# Patient Record
Sex: Male | Born: 1970
Health system: Southern US, Community
[De-identification: ages and names within clinical notes are randomized; demographics above are authoritative.]

## PROBLEM LIST (undated history)

## (undated) DIAGNOSIS — A048 Other specified bacterial intestinal infections: Secondary | ICD-10-CM

## (undated) DIAGNOSIS — M129 Arthropathy, unspecified: Secondary | ICD-10-CM

## (undated) DIAGNOSIS — L409 Psoriasis, unspecified: Secondary | ICD-10-CM

## (undated) DIAGNOSIS — K219 Gastro-esophageal reflux disease without esophagitis: Secondary | ICD-10-CM

## (undated) HISTORY — DX: Psoriasis, unspecified: L40.9

## (undated) HISTORY — DX: Gastro-esophageal reflux disease without esophagitis: K21.9

## (undated) HISTORY — DX: Arthropathy, unspecified: M12.9

## (undated) HISTORY — DX: Other specified bacterial intestinal infections: A04.8

---

## 2002-09-17 ENCOUNTER — Ambulatory Visit (HOSPITAL_BASED_OUTPATIENT_CLINIC_OR_DEPARTMENT_OTHER): Admission: RE | Admit: 2002-09-17 | Discharge: 2002-09-17 | Payer: Self-pay | Admitting: *Deleted

## 2008-03-13 ENCOUNTER — Emergency Department (HOSPITAL_COMMUNITY): Admission: EM | Admit: 2008-03-13 | Discharge: 2008-03-13 | Payer: Self-pay | Admitting: *Deleted

## 2010-04-29 ENCOUNTER — Encounter: Admission: RE | Admit: 2010-04-29 | Discharge: 2010-04-29 | Payer: Self-pay | Admitting: Family Medicine

## 2010-06-30 ENCOUNTER — Encounter (INDEPENDENT_AMBULATORY_CARE_PROVIDER_SITE_OTHER): Payer: Self-pay | Admitting: *Deleted

## 2010-06-30 DIAGNOSIS — B9789 Other viral agents as the cause of diseases classified elsewhere: Secondary | ICD-10-CM | POA: Insufficient documentation

## 2010-07-04 ENCOUNTER — Ambulatory Visit: Payer: Self-pay | Admitting: Infectious Disease

## 2010-07-04 DIAGNOSIS — R5383 Other fatigue: Secondary | ICD-10-CM

## 2010-07-04 DIAGNOSIS — R5381 Other malaise: Secondary | ICD-10-CM

## 2010-07-04 DIAGNOSIS — M129 Arthropathy, unspecified: Secondary | ICD-10-CM

## 2010-07-04 DIAGNOSIS — R141 Gas pain: Secondary | ICD-10-CM | POA: Insufficient documentation

## 2010-07-04 DIAGNOSIS — IMO0001 Reserved for inherently not codable concepts without codable children: Secondary | ICD-10-CM

## 2010-07-04 DIAGNOSIS — R143 Flatulence: Secondary | ICD-10-CM

## 2010-07-04 DIAGNOSIS — A048 Other specified bacterial intestinal infections: Secondary | ICD-10-CM

## 2010-07-04 DIAGNOSIS — K219 Gastro-esophageal reflux disease without esophagitis: Secondary | ICD-10-CM | POA: Insufficient documentation

## 2010-07-04 DIAGNOSIS — R142 Eructation: Secondary | ICD-10-CM

## 2010-07-04 DIAGNOSIS — R197 Diarrhea, unspecified: Secondary | ICD-10-CM | POA: Insufficient documentation

## 2010-07-04 HISTORY — DX: Gastro-esophageal reflux disease without esophagitis: K21.9

## 2010-07-04 HISTORY — DX: Other specified bacterial intestinal infections: A04.8

## 2010-07-04 HISTORY — DX: Arthropathy, unspecified: M12.9

## 2010-07-04 LAB — CONVERTED CEMR LAB
Albumin: 4.4 g/dL (ref 3.5–5.2)
Alkaline Phosphatase: 69 units/L (ref 39–117)
CRP: 0.1 mg/dL (ref <0.6)
Creatinine, Ser: 0.99 mg/dL (ref 0.40–1.50)
Cytomegalovirus Ab-IgG: 0.27 (ref <0.90)
EBV NA IgG: 2.18 — ABNORMAL HIGH
Eosinophils Absolute: 0 10*3/uL (ref 0.0–0.7)
HCV Ab: NEGATIVE
HIV 1 RNA Quant: <20 copies/mL (ref <20)
HIV: NONREACTIVE
Helicobacter Pylori Antibody-IgG: 0.5
Hemoglobin: 16.5 g/dL (ref 13.0–17.0)
Hep B Core Total Ab: NEGATIVE
Hepatitis B Surface Ag: NEGATIVE
Lymphocytes Relative: 33 % (ref 12–46)
Lymphs Abs: 1.8 10*3/uL (ref 0.7–4.0)
MCHC: 35.5 g/dL (ref 30.0–36.0)
MCV: 92.1 fL (ref 78.0–100.0)
Monocytes Relative: 5 % (ref 3–12)
Neutro Abs: 3.3 10*3/uL (ref 1.7–7.7)
Neutrophils Relative %: 62 % (ref 43–77)
RDW: 12.3 % (ref 11.5–15.5)
Sed Rate: 2 mm/hr (ref 0–16)
Sodium: 140 meq/L (ref 135–145)
TSH: 1.319 microintl units/mL (ref 0.350–4.500)
Total Bilirubin: 0.7 mg/dL (ref 0.3–1.2)
Total CK: 103 units/L (ref 7–232)

## 2010-07-07 ENCOUNTER — Encounter: Payer: Self-pay | Admitting: Infectious Disease

## 2010-10-13 NOTE — Miscellaneous (Signed)
Summary: HIPAA Restrictions  HIPAA Restrictions   Imported By: Florinda Marker 07/04/2010 11:04:21  _____________________________________________________________________  External Attachment:    Type:   Image     Comment:   External Document

## 2010-10-13 NOTE — Assessment & Plan Note (Signed)
Summary: new pt viral ilnfection   Visit Type:  Consult Referring Provider:  Forde Dandy Primary Provider:  Forde Dandy, MD  CC:  referred by Dr. Parke Simmers and Depression.  History of Present Illness: 40 yo Caucasian male with no PMHX presents to clinic after referral from Dr. Parke Simmers. The patient and his wife, and daughters began experiencing bloating diarhea, joint pains. Most of these symptoms have waxed and waned since then. The patient himself has also had  elbow and wrist joint pains, body aches, lethargic, fatigue. Then developed lower back pain. Felt malaise. He went to urgent care in [place]. Apparently this MD did work up for tick borne illnesses. His PCP Dr. Parke Simmers did further workup and CT abomen and pelvis. CBC< CMP, RA were all negative. The pt has never had a fever. He has prominent symptoms of burping, GERD, bloating and malaise and fatigue. They all drink well water at home. No known h pylori though no one has been tested. No travel recently  Problems Prior to Update: 1)  Viral Infection  (ICD-079.99)  Medications Prior to Update: 1)  Mobic 15 Mg Tabs (Meloxicam)  Current Medications (verified): 1)  Mobic 15 Mg Tabs (Meloxicam)  Allergies: 1)  ! Pcn    Current Allergies (reviewed today): ! PCN Past History:  Family History: Last updated: 07/04/2010 no rheumatological problems. 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  Past Medical History: uri  Past Surgical History: none  Family History: no rheumatological problems. 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  Review of Systems       The patient complains of abdominal pain and severe  indigestion/heartburn.  The patient denies anorexia, fever, weight loss, weight gain, vision loss, decreased hearing, hoarseness, chest pain, syncope, dyspnea on exertion, peripheral edema, prolonged cough, headaches, hemoptysis, melena, hematochezia, hematuria, incontinence, genital sores, muscle weakness, suspicious skin lesions, transient blindness, difficulty walking, depression, unusual weight change, abnormal bleeding, enlarged lymph nodes, and angioedema.    Vital Signs:  Patient profile:   40 year old male Height:      70 inches (177.80 cm) Weight:      177.50 pounds (80.68 kg) BMI:     25.56 Pulse rate:   63 / minute BP sitting:   142 / 84  (left arm)  Vitals Entered By: Starleen Arms CMA (July 04, 2010 10:48 AM) CC: referred by Dr. Parke Simmers, Depression Is Patient Diabetic? No Pain Assessment Patient in pain? yes       Have you ever been in a relationship where you felt threatened, hurt or afraid?No   Does patient need assistance? Functional Status Self care Ambulation Normal   Physical Exam  General:  alert, well-developed, well-nourished, and well-hydrated.   Head:  normocephalic, atraumatic, and no abnormalities observed.   Eyes:  vision grossly intact, pupils equal, pupils round, and pupils reactive to light.   Ears:  no external deformities.   Nose:  no external deformity.   Mouth:  good dentition, no dental plaque, pharynx pink and moist, no erythema, and no exudates.   Neck:  supple and full ROM.   Chest Wall:  no deformities.   Lungs:  normal respiratory effort, no crackles, and no wheezes.   Heart:  normal rate, regular  rhythm, no murmur, no gallop, and no rub.   Abdomen:  soft, non-tender, normal bowel sounds, no distention, no masses, no hepatomegaly, and no splenomegaly.   Msk:  normal ROM, no joint tenderness, no joint swelling, and no joint warmth.   Extremities:  trace left pedal edema and trace right pedal edema.   Neurologic:  alert & oriented  X3, strength normal in all extremities, and sensation intact to light touch.   Skin:  turgor normal, no rashes, and no petechiae.   Psych:  Oriented X3, memory intact for recent and remote, and normally interactive.     Impression & Recommendations:  Problem # 1:  ABDOMINAL BLOATING (ICD-787.3) I wonder if this could all be explained by h pylor infection. It could certainlhy cause bloating GERD, and could be passed around family members. I am not familiar with it inducing arthritic symptoms though. will check stool h pylori ag and serum ab to h pylori See below further   His updated medication list for this problem includes:    Mobic 15 Mg Tabs (Meloxicam)  Orders: Consultation Level IV (16109)  Problem # 2:  DIARRHEA (ICD-787.91) Will workup further for stool ova and parasites, giardia, crypto from stool, stool. stool cultures Orders: T-Stool Culture (60454) T-Ova and Parasites (09811) Consultation Level IV (91478)  Problem # 3:  ARTHRITIS (ICD-716.90) will check viral hepatitis panel, check hiv, check ebv, cmv, parvovirus abs, check ana, esr, crp, cbc cmp, cpk Orders:  T-Antinuclear Antib (ANA) (29562-13086) T-C-Reactive Protein [phone]) T-GC Probe, urine [phone]) T- GC Chlamydia (02725) T- Sed rate non-auto (36644)  Problem # 4:  FATIGUE (ICD-780.79) workup for viral infections described above and stool pathogens, initate autoimmune workup. However fact that entire family has had similar siymptoms would point to ID cause Orders: T-TSH [phone]) Consultation Level IV [phone])  Other Orders: T-Stool Giardia / Crypto- EIA ([address]) T-Comprehensive Metabolic Panel [phone]) T-CBC w/Diff (30160-10932) T-CK Total [id number]) T-CMV IgG Antibody (42706-23762) T-CMV IgM  Antibody ([address]) T-Helicobacter AB - IgG (07371-06269) T-Epstein-Barr virus, early antigen (48546-27035) T-Epstein-Barr virus, nuclear antigen [phone]) T-Epstein-Barr virus, viral  capsid (37169-67893) T-HIV Antibody  (Reflex) ([id number]) T-H Pylori AG, EIA (85277) T-Hepatitis C Viral Load (82423-53614) T-Hepatitis C Antibody (43154-00867) T-Hepatitis B Surface Antigen (61950-93267) T-Hepatitis B Surface Antibody (12458-09983) T-Hepatitis B Core Antibody (38250-53976) T-Hepatitis A Antibody (73419-37902) T- * Misc. Laboratory test [phone]) T- * Misc. Laboratory test [phone]) T-HIV Viral Load [phone])   Patient Instructions: 1)  rtc to see Dr> Daiva Eves on November 9th 2)  I will call you if you need treatment in the meantime

## 2010-10-13 NOTE — Miscellaneous (Signed)
Summary: clinical list update  Clinical Lists Changes  Problems: Added new problem of VIRAL INFECTION (ICD-079.99) Medications: Added new medication of MOBIC 15 MG TABS (MELOXICAM) Allergies: Added new allergy or adverse reaction of PCN Observations: Added new observation of NKA: F (06/30/2010 16:30)

## 2010-10-13 NOTE — Consult Note (Signed)
Summary: [facility]   Imported By: Florinda Marker 07/07/2010 15:16:37  _____________________________________________________________________  External Attachment:    Type:   Image     Comment:   External Document

## 2010-11-21 ENCOUNTER — Telehealth: Payer: Self-pay | Admitting: *Deleted

## 2010-11-21 ENCOUNTER — Encounter: Payer: Self-pay | Admitting: Infectious Disease

## 2010-11-29 NOTE — Letter (Signed)
Summary: [facility]  [address]   [place], [place] [address]   Phone: [phone]  Fax: [phone]    11/21/2010  [name] [address] [place], [place]  [address]  Dear [name],  Michael Pennington was worked up for flu like symptoms, and athritis with a Hepatitis panel, to exclude viral causes of arthritis. I feel that this was medically necessary.           Sincerely,  Dr. Paulette Blanch [name]

## 2010-11-29 NOTE — Progress Notes (Signed)
Summary: [employer] needing supporting data for labwork @ 07/04/10 OV  Phone Note Other Incoming   Caller: [facility], Claims Dept. fax [phone] Reason for Call: Discuss billing issue Summary of Call: [facility] requiring furhter documentation to support labwork performed during 07/04/10 OV.  Jennet Maduro RN  November 21, 2010 10:36 AM

## 2011-01-27 NOTE — Op Note (Signed)
   NAME:  Michael Pennington, Michael Pennington                         ACCOUNT NO.:  [id number]   MEDICAL RECORD NO.:  [id number]                   PATIENT TYPE:  AMB   LOCATION:  DSC                                  FACILITY:  [facility]   PHYSICIAN:  Lowell Bouton, M.D.      DATE OF BIRTH:  1971/05/14   DATE OF PROCEDURE:  09/17/2002  DATE OF DISCHARGE:                                 OPERATIVE REPORT   PREOPERATIVE DIAGNOSIS:  Foreign body, right thumb.   POSTOPERATIVE DIAGNOSIS:  Foreign body, right thumb.   OPERATION PERFORMED:  Excision of foreign body, right thumb.   SURGEON:  Lowell Bouton, M.D.   ANESTHESIA:  Half percent Marcaine local.   OPERATIVE FINDINGS:  The patient had a piece of glass from a mirror on a car  that had broken into three small pieces.  They were in the subcutaneous  tissues of the pulp of the right thumb.   DESCRIPTION OF OPERATION:  Under half percent Marcaine local anesthesia in  the minor room at [facility] the right hand was prepped and draped in  the usual fashion.  The hand was elevated and a forearm tourniquet was  inflated to 250 mmHg.  An oblique incision was made over the entrance of the  foreign body on the pulp of the right thumb.  The piece of glass was  identified in the subcutaneous tissues upon making the incision.  A second  piece of glass was then bluntly dissected out and a third piece of glass was  bluntly dissected out.   The wound was then closed with 4-0 nylon sutures.  Sterile dressings were  applied and the tourniquet was released.   The patient was discharged in good condition.                                                 Lowell Bouton, M.D.    EMM/MEDQ  D:  09/17/2002  T:  09/17/2002  Job:  [id number]

## 2011-06-08 LAB — DIFFERENTIAL
Basophils Relative: 0
Eosinophils Relative: 0
Lymphs Abs: 0.7
Monocytes Absolute: 0.4
Monocytes Relative: 5
Neutro Abs: 6.2

## 2011-06-08 LAB — CBC
HCT: 47.1
MCV: 93.6
Platelets: 162
RDW: 12.4
WBC: 7.3

## 2011-06-08 LAB — COMPREHENSIVE METABOLIC PANEL
AST: 30
Alkaline Phosphatase: 79
CO2: 23
GFR calc Af Amer: >60
GFR calc non Af Amer: >60
Potassium: 3.8
Total Bilirubin: 1.3 — ABNORMAL HIGH

## 2011-06-08 LAB — URINALYSIS, ROUTINE W REFLEX MICROSCOPIC
Hgb urine dipstick: NEGATIVE
Leukocytes, UA: NEGATIVE
pH: 7.5

## 2011-06-08 LAB — URINE MICROSCOPIC-ADD ON

## 2013-03-12 ENCOUNTER — Emergency Department (HOSPITAL_COMMUNITY)
Admission: EM | Admit: 2013-03-12 | Discharge: 2013-03-12 | Disposition: A | Discharge status: Home / Self Care | Payer: 59 | Attending: Emergency Medicine | Admitting: Emergency Medicine

## 2013-03-12 ENCOUNTER — Encounter (HOSPITAL_COMMUNITY): Payer: Self-pay

## 2013-03-12 ENCOUNTER — Emergency Department (HOSPITAL_COMMUNITY): Payer: 59

## 2013-03-12 DIAGNOSIS — M549 Dorsalgia, unspecified: Secondary | ICD-10-CM | POA: Insufficient documentation

## 2013-03-12 DIAGNOSIS — Z880 Allergy status to penicillin: Secondary | ICD-10-CM | POA: Insufficient documentation

## 2013-03-12 MED ORDER — TRAMADOL HCL 50 MG PO TABS: 50.0000 mg | ORAL_TABLET | Freq: Four times a day (QID) | ORAL | Status: DC | PRN

## 2013-03-12 NOTE — Progress Notes (Signed)
Pt confirms pcp is veita bland  EPIC updated  

## 2013-03-12 NOTE — ED Provider Notes (Signed)
Medical screening examination/treatment/procedure(s) were conducted as a shared visit with non-physician practitioner(s) and myself.  I personally evaluated the patient during the encounter Pt with mid thoracic back pain, worse with movement.  No SOB, chest pain.  Has had similar symptoms off/on for 3 years.  Has had CT chest, endoscopy, x-rays for same symptoms in past.  Pt upset that he got an x-ray today that he did not need.  I explained that he got it since his pain had been ongoing and worsening to r/o tumor.  Will f/u with his PMD  Rolan Bucco, MD 03/12/13 1510

## 2013-03-12 NOTE — ED Notes (Signed)
Pt states at work, developed mid lt back pain with no injury, states burping/gas now; denies pain radiating

## 2013-03-12 NOTE — ED Provider Notes (Signed)
History    CSN: [id number] Arrival date & time 03/12/13  1059  First MD Initiated Contact with Patient 03/12/13 1110     Chief Complaint  Patient presents with  . Back Pain   (Consider location/radiation/quality/duration/timing/severity/associated sxs/prior Treatment) HPI  Patient is a 42 year old male presented to the emergency department for intermittent sharp mid thoracic back pain without radiation. Patient states his pain is worse on the left side from the right Patient rates his pain as an 8/10. Patient describes associated burping and excessive gas with this pain. Patient states he was evaluated for this initially 3 years ago per his primary care doctor had him worked up by an Administrator, arts and gastroenterologist for the same symptoms where he underwent tests including but not limited to an endoscopy with a completely negative workup. Patient states he's had these symptoms on and off over the last 3 years but today states his pain was bothering him too much at work so he decided to come in to be evaluated. Patient states his pain is worsened with movement he has no alleviating factors. Patient denies any chest pain, diaphoresis, shortness of breath, cough, nausea, vomiting, fever, chills, abdominal pain, unilateral leg swelling, numbness or tingling in extremities, bladder or bowel incontinence. PERC negative.  History reviewed. No pertinent past medical history. History reviewed. No pertinent past surgical history. No family history on file. History  Substance Use Topics  . Smoking status: Never Smoker   . Smokeless tobacco: Not on file  . Alcohol Use: No    Review of Systems  Constitutional: Negative for fever, chills and diaphoresis.  HENT: Negative.  Negative for facial swelling, neck pain and neck stiffness.   Eyes: Negative.   Respiratory: Negative for cough, chest tightness and shortness of breath.   Cardiovascular: Negative for chest pain, palpitations and leg swelling.    Gastrointestinal: Negative for nausea, vomiting, abdominal pain, diarrhea and constipation.  Genitourinary: Negative.   Musculoskeletal: Positive for back pain.  Skin: Negative.   Neurological: Negative for syncope, weakness, numbness and headaches.    Allergies  Penicillins  Home Medications   Current Outpatient Rx  Name  Route  Sig  Dispense  Refill  . traMADol (ULTRAM) 50 MG tablet   Oral   Take 1 tablet (50 mg total) by mouth every 6 (six) hours as needed for pain.   30 tablet   0    BP 128/90  Pulse 59  Temp(Src) 98.2 F (36.8 C) (Oral)  Resp 18  SpO2 100% Physical Exam  Constitutional: He is oriented to person, place, and time. He appears well-developed and well-nourished. No distress.  HENT:  Head: Normocephalic and atraumatic.  Eyes: Conjunctivae are normal.  Neck: Neck supple.  Cardiovascular: Normal rate, regular rhythm, normal heart sounds and intact distal pulses.   Pulmonary/Chest: Effort normal and breath sounds normal. No respiratory distress. He exhibits no tenderness.  Abdominal: Soft. Bowel sounds are normal. He exhibits no distension. There is no tenderness. There is no rebound.  Musculoskeletal: He exhibits no edema.       Arms: Neurological: He is alert and oriented to person, place, and time. No cranial nerve deficit.  Skin: Skin is warm and dry. He is not diaphoretic.  Psychiatric: He has a normal mood and affect.    ED Course  Procedures (including critical care time) Labs Reviewed - No data to display Dg Thoracic Spine 2 View  03/12/2013   *RADIOLOGY REPORT*  Clinical Data: Thoracic back pain.  THORACIC SPINE -  2 VIEW  Comparison: Chest x-ray dated 03/13/2008  Findings: The patient has only 11 rib-bearing vertebra.  There is no fracture, disc space narrowing, bone destruction, or other abnormality.  IMPRESSION: Normal thoracic spine except there are only 11 non-rib bearing vertebra.   Original Report Authenticated By: Francene Boyers, M.D.   1.  Back pain     MDM  Patient with midthoracic back pain without neuro focal deficits.  Normal neuro exam.  No cardiac history or in chest pain, diaphoresis, shortness of breath, reproducible chest pain to cause concern for cardiac etiology. Abdomen soft nontender nondistended with good bowel sounds. Pt is PERC negative. No concern for PE. No loss of bowel or bladder control.  No concern for cauda equina.  No fever, night sweats, weight loss, h/o cancer, IVDU.  VSS. RICE protocol and pain medicine indicated and discussed with patient. Patient advised given history of this in he'll need to followup with his parent care doctor for further investigation to see as are the undergone extensive testing in the past for similar symptoms and he should continue care under the initiating physician. Claims no concern for acute emergent cause of his back pain at this time. Patient extremely upset over receiving an x-ray but did not decline the x-ray at any point of his time in the ED. also explained to patient that he would not be doing any further imaging on him at this time because of low concern for acute emergent causes either cardiac or pulmonary or abdominal nature. Patient was spoken to about this by myself, nursing staff, and Dr. Fredderick Phenix.   Jeannetta Ellis, PA-C 03/12/13 1405

## 2016-11-29 ENCOUNTER — Ambulatory Visit: Payer: Self-pay | Admitting: Internal Medicine

## 2017-10-29 ENCOUNTER — Encounter: Payer: Self-pay | Admitting: Physician Assistant

## 2017-10-29 ENCOUNTER — Ambulatory Visit (INDEPENDENT_AMBULATORY_CARE_PROVIDER_SITE_OTHER): Payer: 59 | Admitting: Physician Assistant

## 2017-10-29 ENCOUNTER — Encounter (INDEPENDENT_AMBULATORY_CARE_PROVIDER_SITE_OTHER): Payer: Self-pay

## 2017-10-29 VITALS — BP 130/82 | HR 70 | Ht 70.0 in | Wt 178.8 lb

## 2017-10-29 DIAGNOSIS — R0789 Other chest pain: Secondary | ICD-10-CM | POA: Diagnosis not present

## 2017-10-29 NOTE — Patient Instructions (Signed)
Medication Instructions:  1. Your physician recommends that you continue on your current medications as directed. Please refer to the Current Medication list given to you today.   Labwork: NONE ORDERED TODAY  Testing/Procedures: Your physician has requested that you have an exercise tolerance test. For further information please visit www.cardiosmart.org. Please also follow instruction sheet, as given.    Follow-Up: FOLLOW UP AS NEEDED PENDING TEST RESULTS   Any Other Special Instructions Will Be Listed Below (If Applicable).     If you need a refill on your cardiac medications before your next appointment, please call your pharmacy.   

## 2017-10-29 NOTE — Progress Notes (Signed)
Cardiology Office Note:    Date:  10/29/2017   ID:  Michael Pennington, DOB 02-04-1971, MRN [id number]  PCP:  Verlon AuBoyd, Tammy Lamonica, MD  Cardiologist:   New   Referring MD: Verlon AuBoyd, Tammy Lamonica, MD   Chief Complaint  Patient presents with  . Chest Pain    History of Present Illness:    Michael Pennington is a 47 y.o. male with a hx of psoriasis who is being seen today for the evaluation of chest pain at the request of Verlon AuBoyd, Tammy Lamonica, MD.   Michael Pennington has a history of psoriasis.  He has not had a history of diabetes, hyperlipidemia, hypertension ischemic heart disease or stroke.  He has a remote hx of smoking cigarettes.  He had an evaluation 8 years ago for diffuse arthralgias and belching.  He had an upper endoscopy, abdominal CT which were reportedly unremarkable.  He also was seen by rheumatology without significant findings.  Of note, he had left scapular discomfort with this.  His symptoms eventually resolved.  Last week he started noticing left scapular pain again.  He also noted a soreness in his substernal chest.  Several times last week, he had symptoms of lightheadedness.  One episode occurred shortly after standing.  He denies frank syncope.  He denies exertional chest discomfort.  He denies left scapular pain with range of motion.  He denies he has some neck stiffness for about a week recently.  He also had URI.  All of his symptoms have since resolved, except the L scapular pain.  PAD Screen 10/29/2017  Previous PAD dx? No  Previous surgical procedure? No  Pain with walking? No  Feet/toe relief with dangling? No  Painful, non-healing ulcers? No  Extremities discolored? No    Prior CV studies:   The following studies were reviewed today:  None   Past Medical History:  Diagnosis Date  . ARTHRITIS 07/04/2010  . GERD 07/04/2010   Qualifier: Diagnosis of  By: Daiva EvesVan Dam MD, Remi Haggardornelius    . HELICOBACTER PYLORI INFECTION 07/04/2010  . Psoriasis     History reviewed. No  pertinent surgical history.  Current Medications: Current Meds  Medication Sig  . Clobetasol Propionate (TEMOVATE) 0.05 % external spray Apply 1 spray topically 2 (two) times daily as needed (FOR SKIN IRRITATION).     Allergies:   Penicillins   Social History   Socioeconomic History  . Marital status: Married    Spouse name: None  . Number of children: 3  . Years of education: 5914  . Highest education level: None  Social Needs  . Financial resource strain: None  . Food insecurity - worry: None  . Food insecurity - inability: None  . Transportation needs - medical: None  . Transportation needs - non-medical: None  Occupational History  . Occupation: Sports administratorMechanic    Employer: [employer]  Tobacco Use  . Smoking status: Former Smoker    Packs/day: 0.50    Years: 9.00    Pack years: 4.50    Types: Cigarettes    Last attempt to quit: 1994    Years since quitting: 25.1  . Smokeless tobacco: Never Used  Substance and Sexual Activity  . Alcohol use: No  . Drug use: No  . Sexual activity: None  Other Topics Concern  . None  Social History Narrative   Originally from [place] - attended [employer], tx to [employer] and graduated from there   Attended 2 years at [employer]  in [place]     Family Hx: The patient's family history includes Diabetes in his brother. There is no history of CAD.  ROS:   Please see the history of present illness.    Review of Systems  Cardiovascular: Positive for chest pain.  Musculoskeletal: Positive for back pain.   All other systems reviewed and are negative.   EKGs/Labs/Other Test Reviewed:    EKG:  EKG is  ordered today.  The ekg ordered today demonstrates normal sinus rhythm, heart rate 67, normal axis, QTC 412 ms  Recent Labs:   Recent Lipid Panel    Physical Exam:    VS:  BP 130/82   Pulse 70   Ht 5\' 10"  (1.778 m)   Wt 178 lb 12.8 oz (81.1 kg)   SpO2 97%   BMI 25.66 kg/m     Wt Readings from Last 3  Encounters:  10/29/17 178 lb 12.8 oz (81.1 kg)     Physical Exam  Constitutional: He is oriented to person, place, and time. He appears well-developed and well-nourished. No distress.  HENT:  Head: Normocephalic and atraumatic.  Neck: No JVD present. Carotid bruit is not present. No thyromegaly present.  Cardiovascular: Normal rate and regular rhythm. Exam reveals friction rub. Exam reveals no gallop.  No murmur heard. Pulmonary/Chest: Effort normal. He has no rales.  Abdominal: Soft. He exhibits no mass. There is no tenderness.  Musculoskeletal: He exhibits no edema.  Neurological: He is alert and oriented to person, place, and time.  Skin: Skin is warm and dry.    ASSESSMENT & PLAN:    1.  Other chest pain  Patient presents with chest discomfort with fairly atypical features.  He also has had some dizziness/lightheadedness.  He denies frank syncope.  He lacks significant risk factors for coronary artery disease.  His ECG is normal.  Framingham 10-year risk score 3.5%.  This places him at low risk for MACE.  I have recommended proceeding with a plain exercise treadmill test to further evaluate his symptoms.  If this is normal, he can follow-up with cardiology as needed.   Dispo:  Return as needed .   Medication Adjustments/Labs and Tests Ordered: Current medicines are reviewed at length with the patient today.  Concerns regarding medicines are outlined above.  Orders/Tests:  Orders Placed This Encounter  Procedures  . Exercise Tolerance Test  . EKG 12-Lead   Medication changes: No orders of the defined types were placed in this encounter.  Signed, Tereso Newcomer, PA-C  10/29/2017 2:35 PM    [facility] [address] [place], [place], [place]  [address] Phone: [phone]; Fax: [phone]

## 2017-11-08 ENCOUNTER — Ambulatory Visit: Payer: Self-pay | Admitting: Interventional Cardiology

## 2018-01-16 ENCOUNTER — Telehealth: Payer: Self-pay | Admitting: *Deleted

## 2018-01-16 NOTE — Telephone Encounter (Signed)
I called today to schedule patient for his (ETT), he refused this test.

## 2020-01-24 ENCOUNTER — Other Ambulatory Visit: Payer: Self-pay

## 2020-01-24 ENCOUNTER — Ambulatory Visit
Admission: EM | Admit: 2020-01-24 | Discharge: 2020-01-24 | Disposition: A | Discharge status: Home / Self Care | Payer: 59

## 2020-01-24 DIAGNOSIS — L03116 Cellulitis of left lower limb: Secondary | ICD-10-CM

## 2020-01-24 DIAGNOSIS — W57XXXA Bitten or stung by nonvenomous insect and other nonvenomous arthropods, initial encounter: Secondary | ICD-10-CM

## 2020-01-24 DIAGNOSIS — L03115 Cellulitis of right lower limb: Secondary | ICD-10-CM

## 2020-01-24 MED ORDER — DOXYCYCLINE HYCLATE 100 MG PO CAPS: 100.0000 mg | ORAL_CAPSULE | Freq: Two times a day (BID) | ORAL | 0 refills | Status: DC

## 2020-01-24 NOTE — ED Provider Notes (Signed)
[facility]    CSN: [id number] Arrival date & time: 01/24/20  0623      History   Chief Complaint Chief Complaint  Patient presents with  . Tick Removal  . Rash    HPI Michael Pennington is a 49 y.o. male.   49 year old male comes in for evaluation of tick bite.  He had noticed redness and swelling to bilateral anterior distal thighs for the past few days.  Did a virtual visit last night, and was prescribed Bactrim. States had a black spot to the left anterior thigh that he thought was a scab, but while cleaning the area, found out it was a tick. Thinks the tick has been on for the past 3-4 days since noticing the increased swelling.  Denies fever, chills, body aches.  Denies joint pain, myalgia, headache.     Past Medical History:  Diagnosis Date  . ARTHRITIS 07/04/2010  . GERD 07/04/2010   Qualifier: Diagnosis of  By: Tommy Medal MD, Roderic Scarce    . HELICOBACTER PYLORI INFECTION 07/04/2010  . Psoriasis     Patient Active Problem List   Diagnosis Date Noted  . HELICOBACTER PYLORI INFECTION 07/04/2010  . GERD 07/04/2010  . ARTHRITIS 07/04/2010  . MYALGIA 07/04/2010  . FATIGUE 07/04/2010  . ABDOMINAL BLOATING 07/04/2010  . DIARRHEA 07/04/2010  . VIRAL INFECTION 06/30/2010    History reviewed. No pertinent surgical history.     Home Medications    Prior to Admission medications   Medication Sig Start Date End Date Taking? Authorizing Provider  Clobetasol Propionate (TEMOVATE) 0.05 % external spray Apply 1 spray topically 2 (two) times daily as needed (FOR SKIN IRRITATION).    [provider]  doxycycline (VIBRAMYCIN) 100 MG capsule Take 1 capsule (100 mg total) by mouth 2 (two) times daily. 01/24/20   Ok Edwards, PA-C    Family History Family History  Problem Relation Age of Onset  . Diabetes Brother   . CAD Neg Hx     Social History Social History   Tobacco Use  . Smoking status: Former Smoker    Packs/day: 0.50    Years: 9.00   Pack years: 4.50    Types: Cigarettes    Quit date: 1994    Years since quitting: 27.3  . Smokeless tobacco: Never Used  Substance Use Topics  . Alcohol use: No  . Drug use: No     Allergies   Penicillins   Review of Systems Review of Systems  Reason unable to perform ROS: See HPI as above.     Physical Exam Triage Vital Signs ED Triage Vitals  Enc Vitals Group     BP 01/24/20 0943 124/84     Pulse Rate 01/24/20 0943 76     Resp 01/24/20 0943 14     Temp 01/24/20 0943 98 F (36.7 C)     Temp Source 01/24/20 0943 Oral     SpO2 01/24/20 0943 97 %     Weight --      Height --      Head Circumference --      Peak Flow --      Pain Score 01/24/20 0944 0     Pain Loc --      Pain Edu? --      Excl. in [place]? --    No data found.  Updated Vital Signs BP 124/84 (BP Location: Right Arm)   Pulse 76   Temp 98 F (36.7 C) (Oral)  Resp 14   SpO2 97%   Visual Acuity Right Eye Distance:   Left Eye Distance:   Bilateral Distance:    Right Eye Near:   Left Eye Near:    Bilateral Near:     Physical Exam Constitutional:      General: He is not in acute distress.    Appearance: Normal appearance. He is well-developed. He is not toxic-appearing or diaphoretic.  HENT:     Head: Normocephalic and atraumatic.  Eyes:     Conjunctiva/sclera: Conjunctivae normal.     Pupils: Pupils are equal, round, and reactive to light.  Pulmonary:     Effort: Pulmonary effort is normal. No respiratory distress.     Comments: Speaking in full sentences without difficulty Musculoskeletal:     Cervical back: Normal range of motion and neck supple.     Comments: 7cm x 6cm cellulitis to the right anterior distal thigh. 3cm x 3cm cellulitis to the left anterior distal thigh. No other ticks noted. No abscesses.   Skin:    General: Skin is warm and dry.  Neurological:     Mental Status: He is alert and oriented to person, place, and time.      UC Treatments / Results  Labs (all labs  ordered are listed, but only abnormal results are displayed) Labs Reviewed - No data to display  EKG   Radiology No results found.  Procedures Procedures (including critical care time)  Medications Ordered in UC Medications - No data to display  Initial Impression / Assessment and Plan / UC Course  I have reviewed the triage vital signs and the nursing notes.  Pertinent labs & imaging results that were available during my care of the patient were reviewed by me and considered in my medical decision making (see chart for details).    Discussed continuing Bactrim and monitoring for tickborne disease symptoms versus switching medication to doxycycline.  Risks and benefits discussed.  Patient would like to switch to doxycycline.  Discontinue Bactrim.  Doxycycline twice daily x7 days called into pharmacy.  Return precautions given.  Patient expresses understanding and agrees to plan.  Final Clinical Impressions(s) / UC Diagnoses   Final diagnoses:  Cellulitis of left lower extremity  Cellulitis of right lower extremity  Tick bite, initial encounter   ED Prescriptions    Medication Sig Dispense Auth. Provider   doxycycline (VIBRAMYCIN) 100 MG capsule Take 1 capsule (100 mg total) by mouth 2 (two) times daily. 14 capsule Belinda Fisher, PA-C     PDMP not reviewed this encounter.   Belinda Fisher, PA-C 01/24/20 1049

## 2020-01-24 NOTE — ED Triage Notes (Signed)
Patient presents with a rash on both legs just above the knees that he noticed x 6 days ago.  He did remove a tick last night and was seen via Televideo prior to removing tick.  He was prescribed Bactrim that he started last evening.

## 2020-01-24 NOTE — Discharge Instructions (Signed)
Discontinue bactrim and start doxycycline. This will cover for skin infection, as well as tick borne diseases. Warm compress to the area. Monitor for worsening symptoms, fever, spreading redness, warmth, follow up for reevaluation.

## 2022-01-18 ENCOUNTER — Ambulatory Visit
Admission: EM | Admit: 2022-01-18 | Discharge: 2022-01-18 | Disposition: A | Discharge status: Home / Self Care | Payer: BC Managed Care – PPO | Attending: Emergency Medicine | Admitting: Emergency Medicine

## 2022-01-18 DIAGNOSIS — R8281 Pyuria: Secondary | ICD-10-CM | POA: Diagnosis present

## 2022-01-18 DIAGNOSIS — R109 Unspecified abdominal pain: Secondary | ICD-10-CM | POA: Diagnosis present

## 2022-01-18 DIAGNOSIS — R3129 Other microscopic hematuria: Secondary | ICD-10-CM | POA: Diagnosis present

## 2022-01-18 LAB — POCT URINALYSIS DIP (MANUAL ENTRY)
Bilirubin, UA: NEGATIVE
Glucose, UA: NEGATIVE mg/dL
Nitrite, UA: NEGATIVE
Protein Ur, POC: NEGATIVE mg/dL
Spec Grav, UA: >=1.03 — AB
Urobilinogen, UA: 0.2 U/dL
pH, UA: 5.5

## 2022-01-18 MED ORDER — KETOROLAC TROMETHAMINE 30 MG/ML IJ SOLN
30.0000 mg | Freq: Once | INTRAMUSCULAR | Status: AC
  Administered 2022-01-18: 30 mg via INTRAMUSCULAR

## 2022-01-18 MED ORDER — SULFAMETHOXAZOLE-TRIMETHOPRIM 800-160 MG PO TABS: 1.0000 | ORAL_TABLET | Freq: Two times a day (BID) | ORAL | 0 refills | Status: AC

## 2022-01-18 NOTE — ED Triage Notes (Signed)
Pt c/o mid back pain that radiates to right abd that began yesterday. Patient states lying flat makes the pain better. ?

## 2022-01-18 NOTE — Discharge Instructions (Addendum)
Your urinalysis today revealed a small amount of blood cells and white blood cells present in your urine which is concerning for possible infection somewhere in your urinary tract.  Because you are having persistent pain in a single area of your right mid back that radiates to your abdomen, I am concerned that you may have a kidney stone, particularly given your family's history of kidney stones. ? ?I recommend that you begin taking antibiotic at this time while we await the results of your urine culture.  This takes 3 to 5 days.  Once we receive those results, we will provide further recommendations as indicated. ? ?You were provided with an injection of a nonsteroidal anti-inflammatory pain medication today called ketorolac.  I hope this gives your pain to a tolerable level. ? ?If you have worsening pain and are unable to find any relief, I recommend that you go to the emergency room for possible CT scan of your abdomen to rule out kidney stone.  If there is a kidney stone present, they will be able to provide you to the best course of therapy based on the size of the stone. ? ?Thank you for visiting urgent care today.  We appreciate the opportunity to precipitate your care. ?

## 2022-01-18 NOTE — ED Provider Notes (Signed)
?[facility] ? ? ? ?CSN: [id number] ?Arrival date & time: 01/18/22  1535 ?  ? ?HISTORY  ? ?Chief Complaint  ?Patient presents with  ? Back Pain  ? ?HPI ?Michael Pennington is a 51 y.o. male. Pt c/o mid back pain that radiates to right abdomen that began yesterday. Patient states lying flat makes the pain better.  Patient states he is never had pain in his back like this before.  Patient does report a history of lower back pain but states this is not the same.  Patient points to the area on the right side of his back where it hurts which is just beneath his right rib cage.  Patient denies a history of kidney stones.  Patient states the pain is tolerable at this time.  Patient has normal vital signs on arrival and is appears to be in no acute distress. ? ?The history is provided by the patient.  ?Past Medical History:  ?Diagnosis Date  ? ARTHRITIS 07/04/2010  ? GERD 07/04/2010  ? Qualifier: Diagnosis of  By: Daiva Eves MD, Remi Haggard    ? HELICOBACTER PYLORI INFECTION 07/04/2010  ? Psoriasis   ? ?Patient Active Problem List  ? Diagnosis Date Noted  ? HELICOBACTER PYLORI INFECTION 07/04/2010  ? GERD 07/04/2010  ? ARTHRITIS 07/04/2010  ? MYALGIA 07/04/2010  ? FATIGUE 07/04/2010  ? ABDOMINAL BLOATING 07/04/2010  ? DIARRHEA 07/04/2010  ? VIRAL INFECTION 06/30/2010  ? ?History reviewed. No pertinent surgical history. ? ?Home Medications   ? ?Prior to Admission medications   ?Medication Sig Start Date End Date Taking? Authorizing Provider  ?Clobetasol Propionate (TEMOVATE) 0.05 % external spray Apply 1 spray topically 2 (two) times daily as needed (FOR SKIN IRRITATION).    [provider]  ?doxycycline (VIBRAMYCIN) 100 MG capsule Take 1 capsule (100 mg total) by mouth 2 (two) times daily. 01/24/20   Belinda Fisher, PA-C  ? ? ?Family History ?Family History  ?Problem Relation Age of Onset  ? Diabetes Brother   ? CAD Neg Hx   ? ?Social History ?Social History  ? ?Tobacco Use  ? Smoking status: Former  ?  Packs/day:  0.50  ?  Years: 9.00  ?  Pack years: 4.50  ?  Types: Cigarettes  ?  Quit date: 28  ?  Years since quitting: 29.3  ? Smokeless tobacco: Never  ?Substance Use Topics  ? Alcohol use: No  ? Drug use: No  ? ?Allergies   ?Penicillins ? ?Review of Systems ?Review of Systems ?Pertinent findings noted in history of present illness.  ? ?Physical Exam ?Triage Vital Signs ?ED Triage Vitals  ?Enc Vitals Group  ?   BP 07/08/21 0827 (!) 147/82  ?   Pulse Rate 07/08/21 0827 72  ?   Resp 07/08/21 0827 18  ?   Temp 07/08/21 0827 98.3 ?F (36.8 ?C)  ?   Temp Source 07/08/21 0827 Oral  ?   SpO2 07/08/21 0827 98 %  ?   Weight --   ?   Height --   ?   Head Circumference --   ?   Peak Flow --   ?   Pain Score 07/08/21 0826 5  ?   Pain Loc --   ?   Pain Edu? --   ?   Excl. in [place]? --   ?No data found. ? ?Updated Vital Signs ?BP 124/83 (BP Location: Left Arm)   Pulse 70   Temp 98.1 ?F (36.7 ?C) (Oral)  Resp 18   SpO2 95%  ? ?Physical Exam ?Vitals and nursing note reviewed.  ?Constitutional:   ?   General: He is not in acute distress. ?   Appearance: Normal appearance. He is not ill-appearing.  ?HENT:  ?   Head: Normocephalic and atraumatic.  ?Eyes:  ?   General: Lids are normal.     ?   Right eye: No discharge.     ?   Left eye: No discharge.  ?   Extraocular Movements: Extraocular movements intact.  ?   Conjunctiva/sclera: Conjunctivae normal.  ?   Right eye: Right conjunctiva is not injected.  ?   Left eye: Left conjunctiva is not injected.  ?Neck:  ?   Trachea: Trachea and phonation normal.  ?Cardiovascular:  ?   Rate and Rhythm: Normal rate and regular rhythm.  ?   Pulses: Normal pulses.  ?   Heart sounds: Normal heart sounds. No murmur heard. ?  No friction rub. No gallop.  ?Pulmonary:  ?   Effort: Pulmonary effort is normal. No accessory muscle usage, prolonged expiration or respiratory distress.  ?   Breath sounds: Normal breath sounds. No stridor, decreased air movement or transmitted upper airway sounds. No decreased breath  sounds, wheezing, rhonchi or rales.  ?Chest:  ?   Chest wall: No tenderness.  ?Abdominal:  ?   General: Abdomen is flat. Bowel sounds are normal.  ?   Palpations: Abdomen is soft.  ?   Tenderness: There is no abdominal tenderness. There is right CVA tenderness. There is no left CVA tenderness.  ?Musculoskeletal:     ?   General: Tenderness (Right flank) present. Normal range of motion.  ?   Cervical back: Normal range of motion and neck supple. Normal range of motion.  ?Lymphadenopathy:  ?   Cervical: No cervical adenopathy.  ?Skin: ?   General: Skin is warm and dry.  ?   Findings: No erythema or rash.  ?Neurological:  ?   General: No focal deficit present.  ?   Mental Status: He is alert and oriented to person, place, and time.  ?Psychiatric:     ?   Mood and Affect: Mood normal.     ?   Behavior: Behavior normal.  ? ? ?Visual Acuity ?Right Eye Distance:   ?Left Eye Distance:   ?Bilateral Distance:   ? ?Right Eye Near:   ?Left Eye Near:    ?Bilateral Near:    ? ?UC Couse / Diagnostics / Procedures:  ?  ?EKG ? ?Radiology ?No results found. ? ?Procedures ?Procedures (including critical care time) ? ?UC Diagnoses / Final Clinical Impressions(s)   ?I have reviewed the triage vital signs and the nursing notes. ? ?Pertinent labs & imaging results that were available during my care of the patient were reviewed by me and considered in my medical decision making (see chart for details).   ? ?Final diagnoses:  ?Acute right flank pain  ?Microscopic hematuria  ?Pyuria  ? ?Urine dip today is remarkable for ketones, elevated specific gravity, trace amount of red blood and trace amount of white blood.  Patient does have some mild CVA tenderness but difficult to discern whether this is musculoskeletal based on physical exam findings.  We will send urine for culture.  We will have patient start antibiotics empirically for possible pyelonephritis while we await the results of the culture. ? ?Patient was provided with an injection  during their visit today for acute pain relief. ? ? ? ?  ED Prescriptions   ? ? Medication Sig Dispense Auth. Provider  ? sulfamethoxazole-trimethoprim (BACTRIM DS) 800-160 MG tablet Take 1 tablet by mouth 2 (two) times daily for 5 days. 10 tablet Theadora Rama Scales, PA-C  ? ?  ? ?PDMP not reviewed this encounter. ? ?Pending results:  ?Labs Reviewed  ?POCT URINALYSIS DIP (MANUAL ENTRY) - Abnormal; Notable for the following components:  ?    Result Value  ? Ketones, POC UA trace (5) (*)   ? Spec Grav, UA >=1.030 (*)   ? Blood, UA trace-intact (*)   ? Leukocytes, UA Trace (*)   ? All other components within normal limits  ?URINE CULTURE  ? ? ?Medications Ordered in UC: ?Medications  ?ketorolac (TORADOL) 30 MG/ML injection 30 mg (has no administration in time range)  ? ? ?Disposition Upon Discharge:  ?Condition: stable for discharge home ?Home: take medications as prescribed; routine discharge instructions as discussed; follow up as advised. ? ?Patient presented with an acute illness with associated systemic symptoms and significant discomfort requiring urgent management. In my opinion, this is a condition that a prudent lay person (someone who possesses an average knowledge of health and medicine) may potentially expect to result in complications if not addressed urgently such as respiratory distress, impairment of bodily function or dysfunction of bodily organs.  ? ?Routine symptom specific, illness specific and/or disease specific instructions were discussed with the patient and/or caregiver at length.  ? ?As such, the patient has been evaluated and assessed, work-up was performed and treatment was provided in alignment with urgent care protocols and evidence based medicine.  Patient/parent/caregiver has been advised that the patient may require follow up for further testing and treatment if the symptoms continue in spite of treatment, as clinically indicated and appropriate. ? ?If the patient was tested for  COVID-19, Influenza and/or RSV, then the patient/parent/guardian was advised to isolate at home pending the results of his/her diagnostic coronavirus test and potentially longer if they?re positive. I have also advis

## 2022-01-20 ENCOUNTER — Emergency Department (HOSPITAL_BASED_OUTPATIENT_CLINIC_OR_DEPARTMENT_OTHER): Payer: BC Managed Care – PPO

## 2022-01-20 ENCOUNTER — Other Ambulatory Visit: Payer: Self-pay

## 2022-01-20 ENCOUNTER — Other Ambulatory Visit (HOSPITAL_BASED_OUTPATIENT_CLINIC_OR_DEPARTMENT_OTHER): Payer: Self-pay

## 2022-01-20 ENCOUNTER — Encounter (HOSPITAL_BASED_OUTPATIENT_CLINIC_OR_DEPARTMENT_OTHER): Payer: Self-pay

## 2022-01-20 ENCOUNTER — Emergency Department (HOSPITAL_BASED_OUTPATIENT_CLINIC_OR_DEPARTMENT_OTHER)
Admission: EM | Admit: 2022-01-20 | Discharge: 2022-01-20 | Disposition: A | Discharge status: Home / Self Care | Payer: BC Managed Care – PPO | Attending: Emergency Medicine | Admitting: Emergency Medicine

## 2022-01-20 DIAGNOSIS — R1011 Right upper quadrant pain: Secondary | ICD-10-CM | POA: Insufficient documentation

## 2022-01-20 DIAGNOSIS — B029 Zoster without complications: Secondary | ICD-10-CM | POA: Diagnosis not present

## 2022-01-20 DIAGNOSIS — R109 Unspecified abdominal pain: Secondary | ICD-10-CM

## 2022-01-20 LAB — CBC WITH DIFFERENTIAL/PLATELET
Abs Immature Granulocytes: 0.01 10*3/uL (ref 0.00–0.07)
Basophils Absolute: 0 10*3/uL (ref 0.0–0.1)
Basophils Relative: 1 %
Eosinophils Absolute: 0 10*3/uL (ref 0.0–0.5)
Eosinophils Relative: 0 %
HCT: 44.8 % (ref 39.0–52.0)
Hemoglobin: 15.5 g/dL (ref 13.0–17.0)
Immature Granulocytes: 0 %
Lymphocytes Relative: 24 %
Lymphs Abs: 1.3 10*3/uL (ref 0.7–4.0)
MCH: 31.6 pg (ref 26.0–34.0)
MCHC: 34.6 g/dL (ref 30.0–36.0)
MCV: 91.4 fL (ref 80.0–100.0)
Monocytes Absolute: 0.4 10*3/uL (ref 0.1–1.0)
Monocytes Relative: 7 %
Neutro Abs: 3.5 10*3/uL (ref 1.7–7.7)
Neutrophils Relative %: 68 %
Platelets: 260 10*3/uL (ref 150–400)
RBC: 4.9 MIL/uL (ref 4.22–5.81)
RDW: 11.8 % (ref 11.5–15.5)
WBC: 5.1 10*3/uL (ref 4.0–10.5)
nRBC: 0 % (ref 0.0–0.2)

## 2022-01-20 LAB — URINALYSIS, ROUTINE W REFLEX MICROSCOPIC
Bilirubin Urine: NEGATIVE
Glucose, UA: NEGATIVE mg/dL
Hgb urine dipstick: NEGATIVE
Ketones, ur: NEGATIVE mg/dL
Leukocytes,Ua: NEGATIVE
Nitrite: NEGATIVE
Protein, ur: NEGATIVE mg/dL
Specific Gravity, Urine: 1.015 (ref 1.005–1.030)
pH: 6 (ref 5.0–8.0)

## 2022-01-20 LAB — COMPREHENSIVE METABOLIC PANEL
ALT: 35 U/L (ref 0–44)
AST: 26 U/L (ref 15–41)
Albumin: 3.9 g/dL (ref 3.5–5.0)
Alkaline Phosphatase: 74 U/L (ref 38–126)
Anion gap: 7 (ref 5–15)
BUN: 16 mg/dL (ref 6–20)
CO2: 25 mmol/L (ref 22–32)
Calcium: 9.1 mg/dL (ref 8.9–10.3)
Chloride: 104 mmol/L (ref 98–111)
Creatinine, Ser: 1.27 mg/dL — ABNORMAL HIGH (ref 0.61–1.24)
GFR, Estimated: >60 mL/min (ref >60)
Glucose, Bld: 104 mg/dL — ABNORMAL HIGH (ref 70–99)
Potassium: 4.2 mmol/L (ref 3.5–5.1)
Sodium: 136 mmol/L (ref 135–145)
Total Bilirubin: 0.8 mg/dL (ref 0.3–1.2)
Total Protein: 6.8 g/dL (ref 6.5–8.1)

## 2022-01-20 LAB — URINE CULTURE: Culture: NO GROWTH

## 2022-01-20 LAB — LIPASE, BLOOD: Lipase: 28 U/L (ref 11–51)

## 2022-01-20 MED ORDER — LACTATED RINGERS IV BOLUS
1000.0000 mL | Freq: Once | INTRAVENOUS | Status: AC
  Administered 2022-01-20: 1000 mL via INTRAVENOUS

## 2022-01-20 MED ORDER — VALACYCLOVIR HCL 1 G PO TABS
1000.0000 mg | ORAL_TABLET | Freq: Three times a day (TID) | ORAL | 0 refills | Status: AC
  Filled 2022-01-20: qty 42, 14d supply, fill #0

## 2022-01-20 MED ORDER — KETOROLAC TROMETHAMINE 30 MG/ML IJ SOLN
30.0000 mg | Freq: Once | INTRAMUSCULAR | Status: AC
Start: 2022-01-20 — End: 2022-01-20
  Administered 2022-01-20: 30 mg via INTRAVENOUS
  Filled 2022-01-20: qty 1

## 2022-01-20 MED ORDER — OXYCODONE HCL 5 MG PO TABS
5.0000 mg | ORAL_TABLET | ORAL | 0 refills | Status: AC | PRN
  Filled 2022-01-20: qty 10, 2d supply, fill #0

## 2022-01-20 NOTE — ED Provider Notes (Signed)
?[facility] ?Provider Note ? ? ?CSN: [id number] ?Arrival date & time: 01/20/22  0954 ? ?  ? ?History ? ?No chief complaint on file. ? ? ?Michael Pennington is a 51 y.o. male. ? ?HPI ? ?  ? ?51yo male with history of psoriasis, GERD, presents with concern for right flank pain. ? ?Began Tuesday afternoon. Was seen at urgent care. Given bactrim for possible UTI.  Stabbing pain to right back, radiation to right upper quadrant abdomen.  No nausea, vomiting, dysuria, hematuria, diarrhea, constipation.  Brother and dad have hx of nephrolithiasis. No hx of this himself.  Pain not worse with eating.  Severe, last night couldn't sleep. No fever. Did not notice rash.  ? ?Past Medical History:  ?Diagnosis Date  ? ARTHRITIS 07/04/2010  ? GERD 07/04/2010  ? Qualifier: Diagnosis of  By: Daiva Eves MD, Remi Haggard    ? HELICOBACTER PYLORI INFECTION 07/04/2010  ? Psoriasis   ?  ? ?Home Medications ?Prior to Admission medications   ?Medication Sig Start Date End Date Taking? Authorizing Provider  ?oxyCODONE (ROXICODONE) 5 MG immediate release tablet Take 1 tablet (5 mg total) by mouth every 4 (four) hours as needed for severe pain. 01/20/22  Yes Alvira Monday, MD  ?valACYclovir (VALTREX) 1000 MG tablet Take 1 tablet (1,000 mg total) by mouth 3 (three) times daily for 14 days. 01/20/22 02/03/22 Yes Alvira Monday, MD  ?sulfamethoxazole-trimethoprim (BACTRIM DS) 800-160 MG tablet Take 1 tablet by mouth 2 (two) times daily for 5 days. 01/18/22 01/23/22  Theadora Rama Scales, PA-C  ?   ? ?Allergies    ?Penicillins   ? ?Review of Systems   ?Review of Systems ? ?Physical Exam ?Updated Vital Signs ?BP (!) 145/86   Pulse 67   Temp 97.8 ?F (36.6 ?C) (Oral)   Resp 16   Ht 5\' 10"  (1.778 m)   Wt 81.6 kg   SpO2 100%   BMI 25.83 kg/m?  ?Physical Exam ?Vitals and nursing note reviewed.  ?Constitutional:   ?   General: He is not in acute distress. ?   Appearance: Normal appearance. He is not ill-appearing,  toxic-appearing or diaphoretic.  ?HENT:  ?   Head: Normocephalic.  ?Eyes:  ?   Conjunctiva/sclera: Conjunctivae normal.  ?Cardiovascular:  ?   Rate and Rhythm: Normal rate and regular rhythm.  ?   Pulses: Normal pulses.  ?Pulmonary:  ?   Effort: Pulmonary effort is normal. No respiratory distress.  ?Abdominal:  ?   Tenderness: There is abdominal tenderness. There is right CVA tenderness. There is no left CVA tenderness.  ?   Comments: Grouped vesicles ruq  ?Musculoskeletal:     ?   General: No deformity or signs of injury.  ?   Cervical back: No rigidity.  ?Skin: ?   General: Skin is warm and dry.  ?   Coloration: Skin is not jaundiced or pale.  ?Neurological:  ?   General: No focal deficit present.  ?   Mental Status: He is alert and oriented to person, place, and time.  ? ? ?ED Results / Procedures / Treatments   ?Labs ?(all labs ordered are listed, but only abnormal results are displayed) ?Labs Reviewed  ?COMPREHENSIVE METABOLIC PANEL - Abnormal; Notable for the following components:  ?    Result Value  ? Glucose, Bld 104 (*)   ? Creatinine, Ser 1.27 (*)   ? All other components within normal limits  ?URINE CULTURE  ?CBC WITH DIFFERENTIAL/PLATELET  ?LIPASE,  BLOOD  ?URINALYSIS, ROUTINE W REFLEX MICROSCOPIC  ? ? ?EKG ?None ? ?Radiology ?CT Renal Stone Study ? ?Result Date: 01/20/2022 ?CLINICAL DATA:  Acute right flank pain. EXAM: CT ABDOMEN AND PELVIS WITHOUT CONTRAST TECHNIQUE: Multidetector CT imaging of the abdomen and pelvis was performed following the standard protocol without IV contrast. RADIATION DOSE REDUCTION: This exam was performed according to the departmental dose-optimization program which includes automated exposure control, adjustment of the mA and/or kV according to patient size and/or use of iterative reconstruction technique. COMPARISON:  October 24, 2018. FINDINGS: Lower chest: No acute abnormality. Hepatobiliary: No focal liver abnormality is seen. No gallstones, gallbladder wall thickening,  or biliary dilatation. Pancreas: Unremarkable. No pancreatic ductal dilatation or surrounding inflammatory changes. Spleen: Normal in size without focal abnormality. Adrenals/Urinary Tract: Adrenal glands appear normal. Small nonobstructive left renal calculus is noted. No hydronephrosis or renal obstruction is noted. Urinary bladder is unremarkable. Stomach/Bowel: Stomach is within normal limits. Appendix appears normal. No evidence of bowel wall thickening, distention, or inflammatory changes. Vascular/Lymphatic: No significant vascular findings are present. No enlarged abdominal or pelvic lymph nodes. Reproductive: Prostate is unremarkable. Other: No abdominal wall hernia or abnormality. No abdominopelvic ascites. Musculoskeletal: No acute or significant osseous findings. IMPRESSION: Small nonobstructive left renal calculus. No hydronephrosis or renal obstruction is noted. Electronically Signed   By: Lupita Raider M.D.   On: 01/20/2022 11:14   ? ?Procedures ?Procedures  ? ? ?Medications Ordered in ED ?Medications  ?lactated ringers bolus 1,000 mL (0 mLs Intravenous Stopped 01/20/22 1148)  ?ketorolac (TORADOL) 30 MG/ML injection 30 mg (30 mg Intravenous Given 01/20/22 1036)  ? ? ?ED Course/ Medical Decision Making/ A&P ?  ?                        ?Medical Decision Making ?Amount and/or Complexity of Data Reviewed ?Labs: ordered. ?Radiology: ordered. ? ?Risk ?Prescription drug management. ? ? ?51yo male with history of psoriasis, GERD, presents with concern for right flank pain. ? ?DDx includes appendicitis, pancreatitis, cholecystitis, pyelonephritis, nephrolithias. ? ?CT stone study shows left nonobstructive nephrolithiasis, no right nephrolithiasis, no appendicitis, no GB abnormalities.  UA without infection.  ? ?Exam shows shingles. Valacyclovir rx given, recommend tylenol/ibuprofen, given rx for oxycodone for breakthrough pain.  Patient discharged in stable condition with understanding of reasons to return.    ? ? ? ? ? ? ? ? ?Final Clinical Impression(s) / ED Diagnoses ?Final diagnoses:  ?Acute right flank pain  ?Herpes zoster without complication  ? ? ?Rx / DC Orders ?ED Discharge Orders   ? ?      Ordered  ?  valACYclovir (VALTREX) 1000 MG tablet  3 times daily       ? 01/20/22 1129  ?  oxyCODONE (ROXICODONE) 5 MG immediate release tablet  Every 4 hours PRN       ? 01/20/22 1129  ? ?  ?  ? ?  ? ? ?  ?Alvira Monday, MD ?01/20/22 2245 ? ?

## 2022-01-20 NOTE — Discharge Instructions (Addendum)
It was a pleasure caring for you. You have shingles. Your urine does not look infected and you can stop taking your bactrim.  ? ?You may take 1000 mg of tylenol/acetaminophen 4 times a day for 1 week. This is the maximum dose of acetaminophen you can take from all sources. Please check other over-the-counter medications and prescriptions to ensure you are not taking other medications that contain acetaminophen.  You may also take ibuprofen 400 mg 6 times a day alternating with or at the same time as tylenol.  Take oxycodone as needed for breakthrough pain.  This medication can be addicting, sedating and cause constipation.   ?

## 2022-01-20 NOTE — ED Notes (Signed)
Patient transported to CT 

## 2022-01-20 NOTE — ED Notes (Signed)
ED Provider at bedside. 

## 2022-01-20 NOTE — ED Triage Notes (Signed)
Pt C/O mid right back/  flank pain that radiates to the right abd that began Tuesday afternoon. Pt was seen at urgent care and was prescribed abx. Denies n/v or issues producing urine. Pt ambulatory to room 12. NAD at this time.   ?

## 2022-01-21 LAB — URINE CULTURE: Culture: NO GROWTH

## 2022-01-27 NOTE — Progress Notes (Signed)
Please mail patient's results.  Thank you!

## 2023-12-17 IMAGING — CT CT RENAL STONE PROTOCOL
2 of 4 series · 16 of 46 positions shown, 18 images · non-contrast
Comparison: October 24, 2018.

CLINICAL DATA: Acute right flank pain.



[Series 2: axial st · axial · 0.92mm/px · z∈[-556,-56]mm · 13 of 110 slices shown, 15 images]
[im 5/110  soft-tissue]
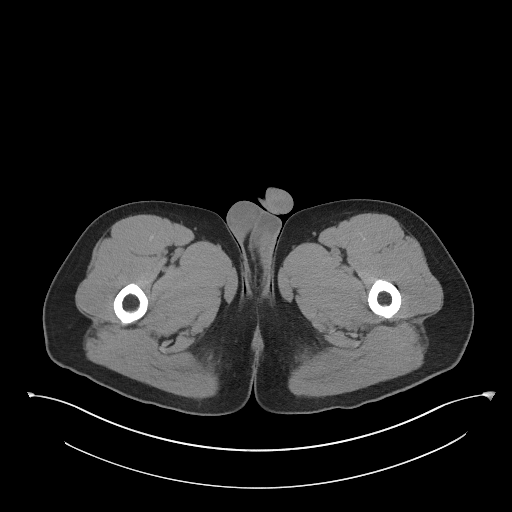
[im 5/110  bone]
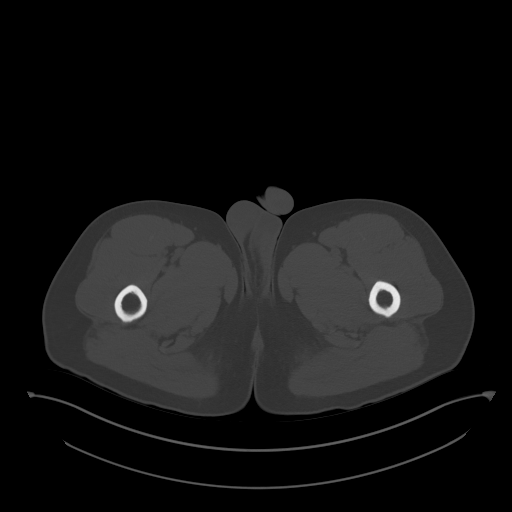
[im 14/110  soft-tissue]
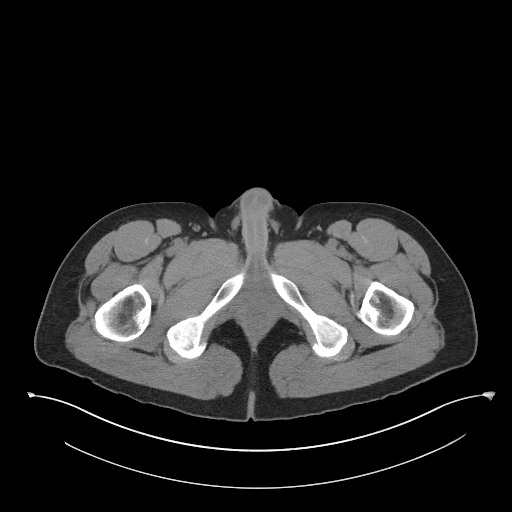
[im 22/110  soft-tissue]
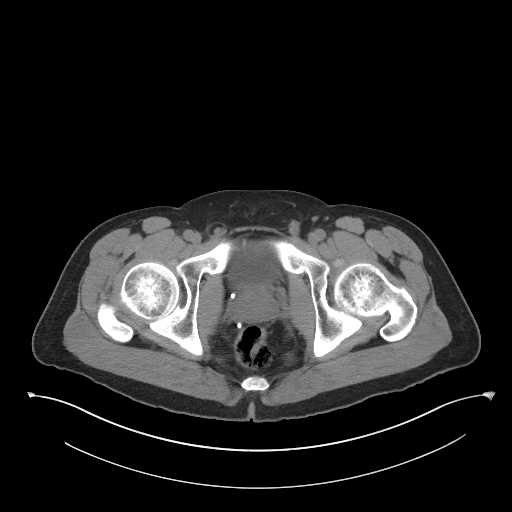
[im 31/110  soft-tissue]
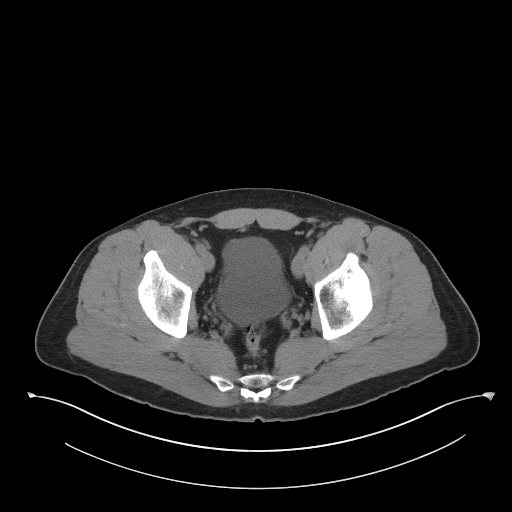
[im 40/110  soft-tissue]
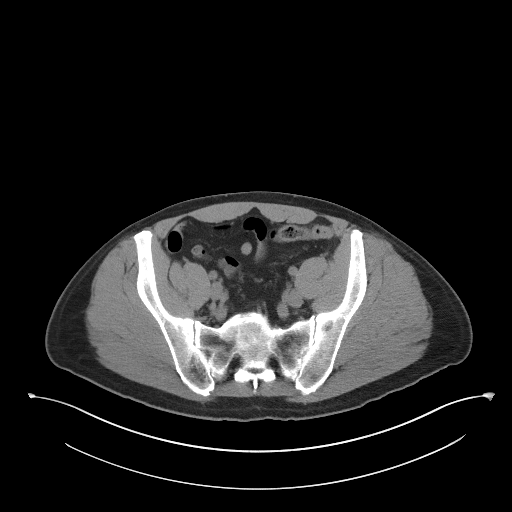
[im 48/110  soft-tissue]
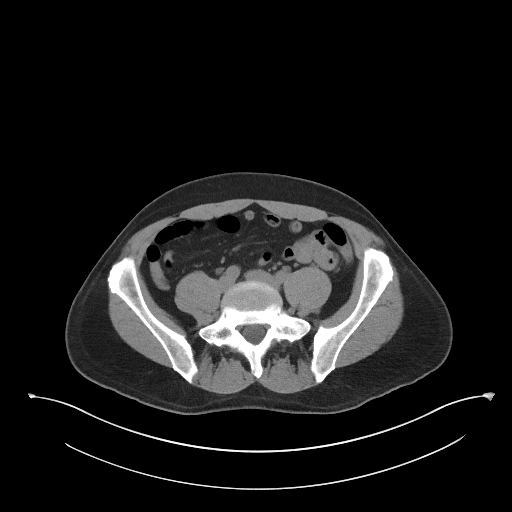
[im 57/110  soft-tissue]
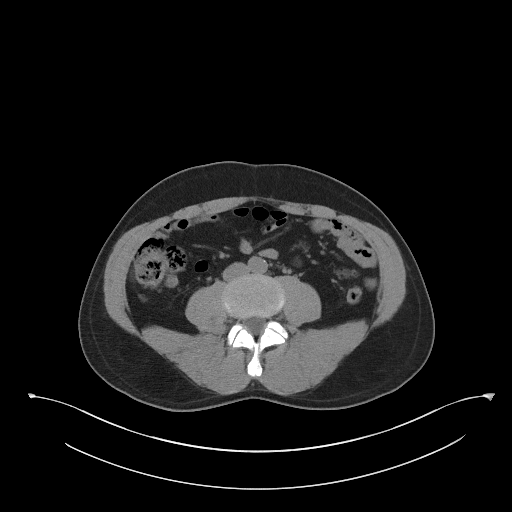
[im 62/110  soft-tissue]
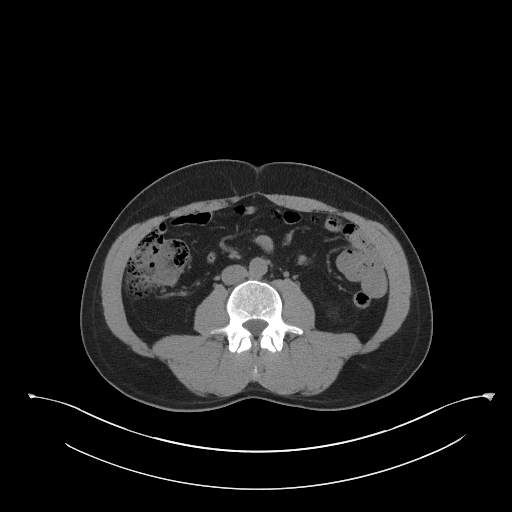
[im 70/110  soft-tissue]
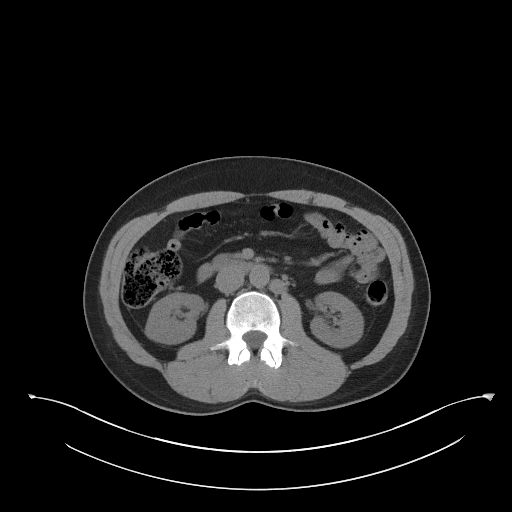
[im 70/110  bone]
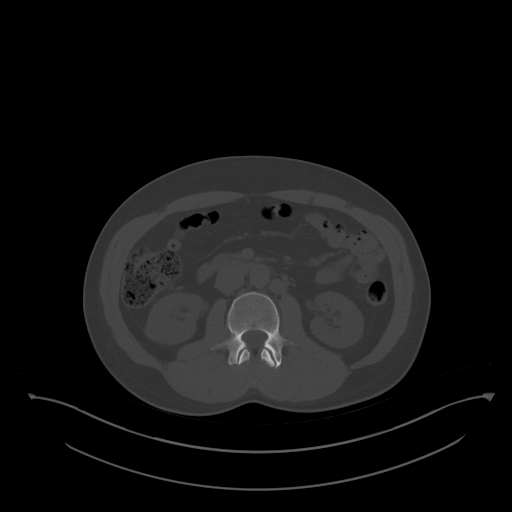
[im 79/110  soft-tissue]
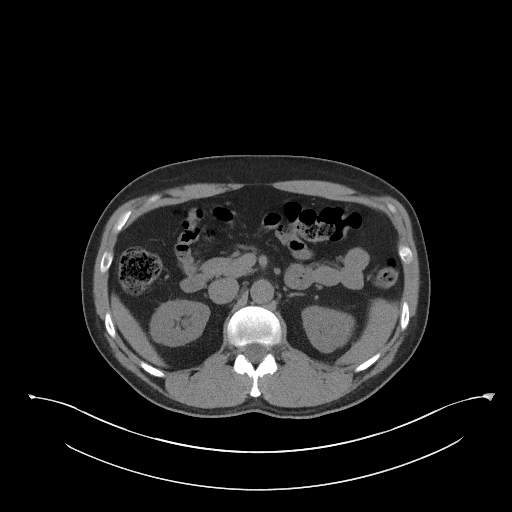
[im 88/110  soft-tissue]
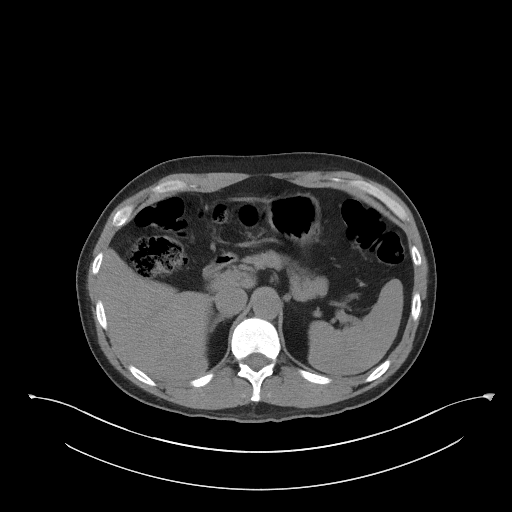
[im 96/110  soft-tissue]
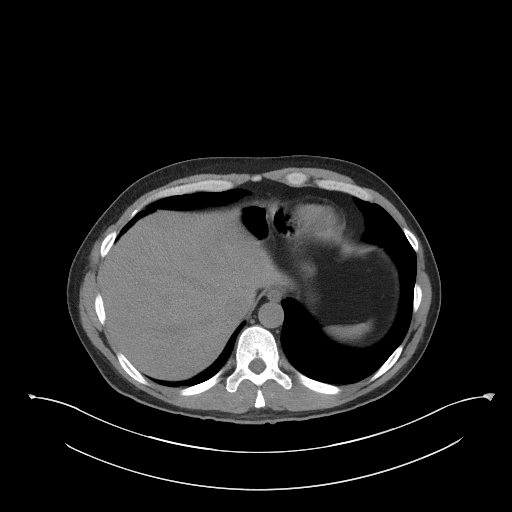
[im 105/110  soft-tissue]
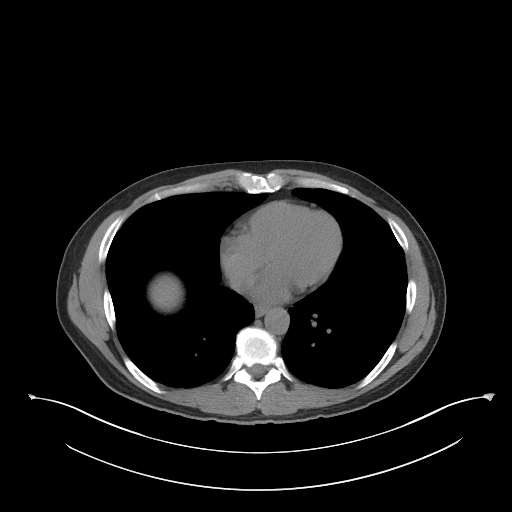

[Series 5: coronal st · coronal · 0.82mm/px · 3 of 88 slices shown]
[im 30/88  soft-tissue]
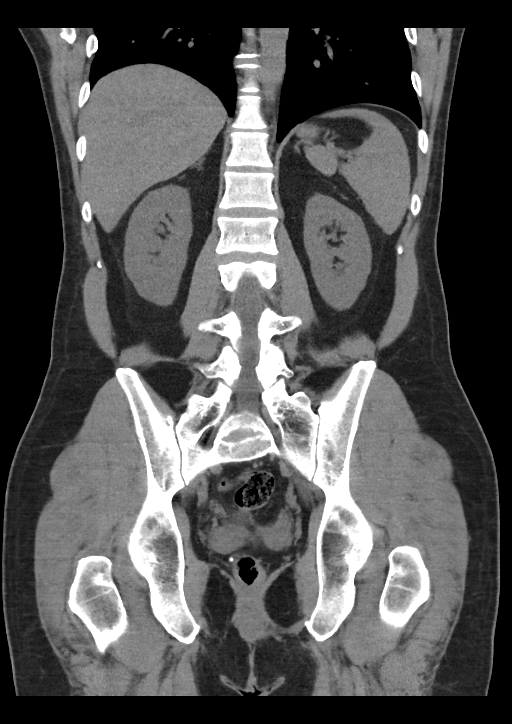
[im 39/88  soft-tissue]
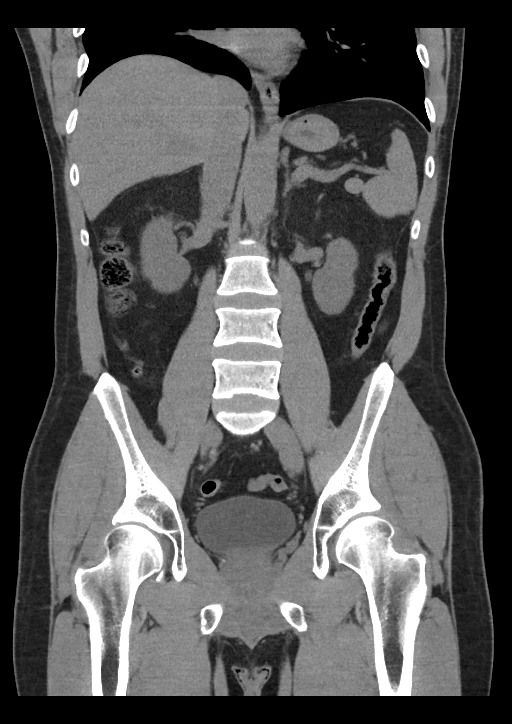
[im 49/88  soft-tissue]
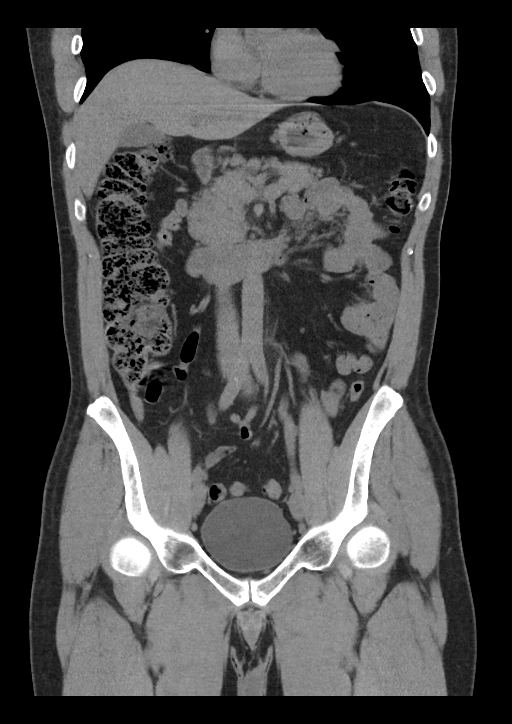

[16 of 46 positions shown; findings below may reference images not displayed]

FINDINGS: Lower chest: No acute abnormality.

Hepatobiliary: No focal liver abnormality is seen. No gallstones,
gallbladder wall thickening, or biliary dilatation.

Pancreas: Unremarkable. No pancreatic ductal dilatation or
surrounding inflammatory changes.

Spleen: Normal in size without focal abnormality.

Adrenals/Urinary Tract: Adrenal glands appear normal. Small
nonobstructive left renal calculus is noted. No hydronephrosis or
renal obstruction is noted. Urinary bladder is unremarkable.

Stomach/Bowel: Stomach is within normal limits. Appendix appears
normal. No evidence of bowel wall thickening, distention, or
inflammatory changes.

Vascular/Lymphatic: No significant vascular findings are present. No
enlarged abdominal or pelvic lymph nodes.

Reproductive: Prostate is unremarkable.

Other: No abdominal wall hernia or abnormality. No abdominopelvic
ascites.

Musculoskeletal: No acute or significant osseous findings.
IMPRESSION: Small nonobstructive left renal calculus. No hydronephrosis or renal
obstruction is noted.

## 2024-05-22 DIAGNOSIS — Z13220 Encounter for screening for lipoid disorders: Secondary | ICD-10-CM | POA: Diagnosis not present

## 2024-05-22 DIAGNOSIS — N529 Male erectile dysfunction, unspecified: Secondary | ICD-10-CM | POA: Diagnosis not present

## 2024-05-22 DIAGNOSIS — Z0000 Encounter for general adult medical examination without abnormal findings: Secondary | ICD-10-CM | POA: Diagnosis not present

## 2024-05-22 DIAGNOSIS — Z125 Encounter for screening for malignant neoplasm of prostate: Secondary | ICD-10-CM | POA: Diagnosis not present

## 2024-05-22 DIAGNOSIS — F4320 Adjustment disorder, unspecified: Secondary | ICD-10-CM | POA: Diagnosis not present

## 2024-05-22 DIAGNOSIS — Z131 Encounter for screening for diabetes mellitus: Secondary | ICD-10-CM | POA: Diagnosis not present

## 2024-05-22 DIAGNOSIS — Z23 Encounter for immunization: Secondary | ICD-10-CM | POA: Diagnosis not present

## 2024-06-16 DIAGNOSIS — M25552 Pain in left hip: Secondary | ICD-10-CM | POA: Diagnosis not present

## 2024-07-29 DIAGNOSIS — R519 Headache, unspecified: Secondary | ICD-10-CM | POA: Diagnosis not present

## 2024-07-29 DIAGNOSIS — G44099 Other trigeminal autonomic cephalgias (TAC), not intractable: Secondary | ICD-10-CM | POA: Diagnosis not present

## 2024-07-29 DIAGNOSIS — H9201 Otalgia, right ear: Secondary | ICD-10-CM | POA: Diagnosis not present
